# Patient Record
Sex: Male | Born: 1990 | Race: White | Hispanic: No | Marital: Single | Smoking: Never smoker
Health system: Southern US, Community
[De-identification: ages and names within clinical notes are randomized; demographics above are authoritative.]

## PROBLEM LIST (undated history)

## (undated) DIAGNOSIS — I10 Essential (primary) hypertension: Secondary | ICD-10-CM

## (undated) HISTORY — DX: Essential (primary) hypertension: I10

---
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
## 2014-09-20 ENCOUNTER — Ambulatory Visit (INDEPENDENT_AMBULATORY_CARE_PROVIDER_SITE_OTHER): Payer: Worker's Compensation

## 2014-09-20 ENCOUNTER — Ambulatory Visit (INDEPENDENT_AMBULATORY_CARE_PROVIDER_SITE_OTHER): Payer: Worker's Compensation | Admitting: Family Medicine

## 2014-09-20 ENCOUNTER — Encounter: Payer: Self-pay | Admitting: Family Medicine

## 2014-09-20 VITALS — BP 139/84 | HR 76 | Temp 98.6°F | Ht 72.0 in | Wt 310.0 lb

## 2014-09-20 DIAGNOSIS — R0782 Intercostal pain: Secondary | ICD-10-CM | POA: Insufficient documentation

## 2014-09-20 DIAGNOSIS — I10 Essential (primary) hypertension: Secondary | ICD-10-CM | POA: Insufficient documentation

## 2014-09-20 NOTE — Progress Notes (Signed)
BP 139/84 mmHg  Pulse 76  Temp(Src) 98.6 F (37 C) (Oral)  Ht 6' (1.829 m)  Wt 310 lb (140.615 kg)  BMI 42.03 kg/m2   Subjective:    Patient ID: Steven Christian, male    DOB: Jan 28, 1990, 24 y.o.   MRN: [id number]  HPI: Steven Christian is a 24 y.o. male presenting on 09/20/2014 for chest wall pain   HPI Worker's Comp. Patient presents today for worker's comp injury. He injured himself yesterday at work for routine. He injured himself in the center of his chest anteriorly. He injured himself trying to catch a very large rack and it fell on top of his chest. He does not have any bruising he does not complain of any difficulties breathing, moving his arms, lifting. Chest x-ray was performed today. Patient has been taking the occasional ibuprofen and been satisfied with that.  Relevant past medical, surgical, family and social history reviewed and updated as indicated. Interim medical history since our last visit reviewed. Allergies and medications reviewed and updated.  Review of Systems  Constitutional: Negative for fever and chills.  HENT: Negative for ear discharge and ear pain.   Eyes: Negative for discharge and visual disturbance.  Respiratory: Negative for shortness of breath and wheezing.   Cardiovascular: Positive for chest pain (where object fell on his chest). Negative for leg swelling.  Gastrointestinal: Negative for abdominal pain, diarrhea and constipation.  Genitourinary: Negative for difficulty urinating.  Musculoskeletal: Negative for back pain and gait problem.  Skin: Negative for rash.  Neurological: Negative for dizziness, syncope, light-headedness and headaches.  All other systems reviewed and are negative.   Per HPI unless specifically indicated above     Medication List       This list is accurate as of: 09/20/14  5:08 PM.  Always use your most recent med list.               losartan 100 MG tablet  Commonly known as:  COZAAR  Take 100 mg by mouth  daily.           Objective:    BP 139/84 mmHg  Pulse 76  Temp(Src) 98.6 F (37 C) (Oral)  Ht 6' (1.829 m)  Wt 310 lb (140.615 kg)  BMI 42.03 kg/m2  Wt Readings from Last 3 Encounters:  09/20/14 310 lb (140.615 kg)    Physical Exam  Constitutional: He is oriented to person, place, and time. He appears well-developed and well-nourished. No distress.  Eyes: Conjunctivae and EOM are normal. Pupils are equal, round, and reactive to light. Right eye exhibits no discharge. No scleral icterus.  Cardiovascular: Normal rate, regular rhythm, normal heart sounds and intact distal pulses.   No murmur heard. Pulmonary/Chest: Effort normal and breath sounds normal. No respiratory distress. He has no wheezes. He exhibits tenderness (palpable tenderness along the ixth to seventh rib on the left side of the sternum, and along the third rib on the right side of his sternum. No visible bruising).  Abdominal: He exhibits no distension.  Musculoskeletal: Normal range of motion. He exhibits no edema.  Neurological: He is alert and oriented to person, place, and time. Coordination normal.  Skin: Skin is warm and dry. No rash noted. He is not diaphoretic.  Psychiatric: He has a normal mood and affect. His behavior is normal.  Vitals reviewed.  Chest x-ray: Preliminary read by Dr. Louanne Skye on the chest x-ray shows no bony abnormalities and normal lung spaces. Await final read by radiology.  No results found for this or any previous visit.    Assessment & Plan:   Problem List Items Addressed This Visit      Other   Intercostal pain - Primary    Patient has pain in the anterior chest where the object fell on his chest. No visible bruising noted and no abnormalities noted on chest x-ray. Await final read on chest x-ray from radiologist. Recommend ibuprofen and ice and heat. May return to work with no time off needed.      Relevant Orders   DG Chest 2 View (Completed)       Follow up  plan: Return if symptoms worsen or fail to improve.  Arville Care, MD [facility] 09/20/2014, 5:08 PM

## 2014-09-20 NOTE — Patient Instructions (Signed)

## 2014-09-20 NOTE — Assessment & Plan Note (Signed)
Patient has pain in the anterior chest where the object fell on his chest. No visible bruising noted and no abnormalities noted on chest x-ray. Await final read on chest x-ray from radiologist. Recommend ibuprofen and ice and heat. May return to work with no time off needed.

## 2015-09-25 ENCOUNTER — Encounter: Payer: Self-pay | Admitting: Nurse Practitioner

## 2015-09-25 ENCOUNTER — Ambulatory Visit (INDEPENDENT_AMBULATORY_CARE_PROVIDER_SITE_OTHER): Payer: Worker's Compensation | Admitting: Nurse Practitioner

## 2015-09-25 ENCOUNTER — Ambulatory Visit (INDEPENDENT_AMBULATORY_CARE_PROVIDER_SITE_OTHER): Payer: Worker's Compensation

## 2015-09-25 VITALS — BP 146/88 | HR 101 | Temp 98.6°F | Ht 72.0 in | Wt 296.0 lb

## 2015-09-25 DIAGNOSIS — M79642 Pain in left hand: Secondary | ICD-10-CM | POA: Diagnosis not present

## 2015-09-25 DIAGNOSIS — S60052A Contusion of left little finger without damage to nail, initial encounter: Secondary | ICD-10-CM | POA: Diagnosis not present

## 2015-09-25 NOTE — Progress Notes (Signed)
   Subjective:    Patient ID: Lanell PersonsLandon Keithly, male    DOB: 09-19-1990, 25 y.o.   MRN: [id number]  HPI  DATE OF INJURY- 09/18/15  [place] Patient was at work on 09/18/15 and he went to hand a part to co-worker and they did not have grip on it and it fell and landed on his left hand- was sore and swollen but was able to function- has a knot on his hand that is still tender to touch and it hurts to grip things.   Review of Systems  Constitutional: Negative.   HENT: Negative.   Respiratory: Negative.   Cardiovascular: Negative.   Genitourinary: Negative.   Musculoskeletal:       Hand pain  Neurological: Negative.   Psychiatric/Behavioral: Negative.   All other systems reviewed and are negative.      Objective:   Physical Exam  Constitutional: He appears well-developed and well-nourished. No distress.  Cardiovascular: Normal rate, regular rhythm and normal heart sounds.   Pulmonary/Chest: Effort normal and breath sounds normal.  Musculoskeletal:  2cm tnder soft nodule at left second metacarpal- slight pain with gripping.  Skin: Skin is warm.  Psychiatric: He has a normal mood and affect. His behavior is normal. Judgment and thought content normal.    BP (!) 146/88 (BP Location: Left Arm, Cuff Size: Normal)   Pulse (!) 101   Temp 98.6 F (37 C) (Oral)   Ht 6' (1.829 m)   Wt 296 lb (134.3 kg)   BMI 40.14 kg/m   Left hand x ray- no fractures-Preliminary reading by Paulene FloorMary Belma Dyches, FNP  [facility]      Assessment & Plan:   1. Pain of left hand   2. Contusion of fifth finger of left hand, initial encounter    Soak in epsom salt BID if helps Motrin or tylenol OTC RTO prn  Mary-Margaret Daphine DeutscherMartin, FNP

## 2016-07-30 IMAGING — CR DG CHEST 2V
2 series · 2 of 2 positions shown · non-contrast
Comparison: None.

CLINICAL DATA: 40 lb weight fell on chest. Chest wall pain. Initial
encounter.

EXAM:
CHEST  2 VIEW

[view not recorded (1 of 2)]
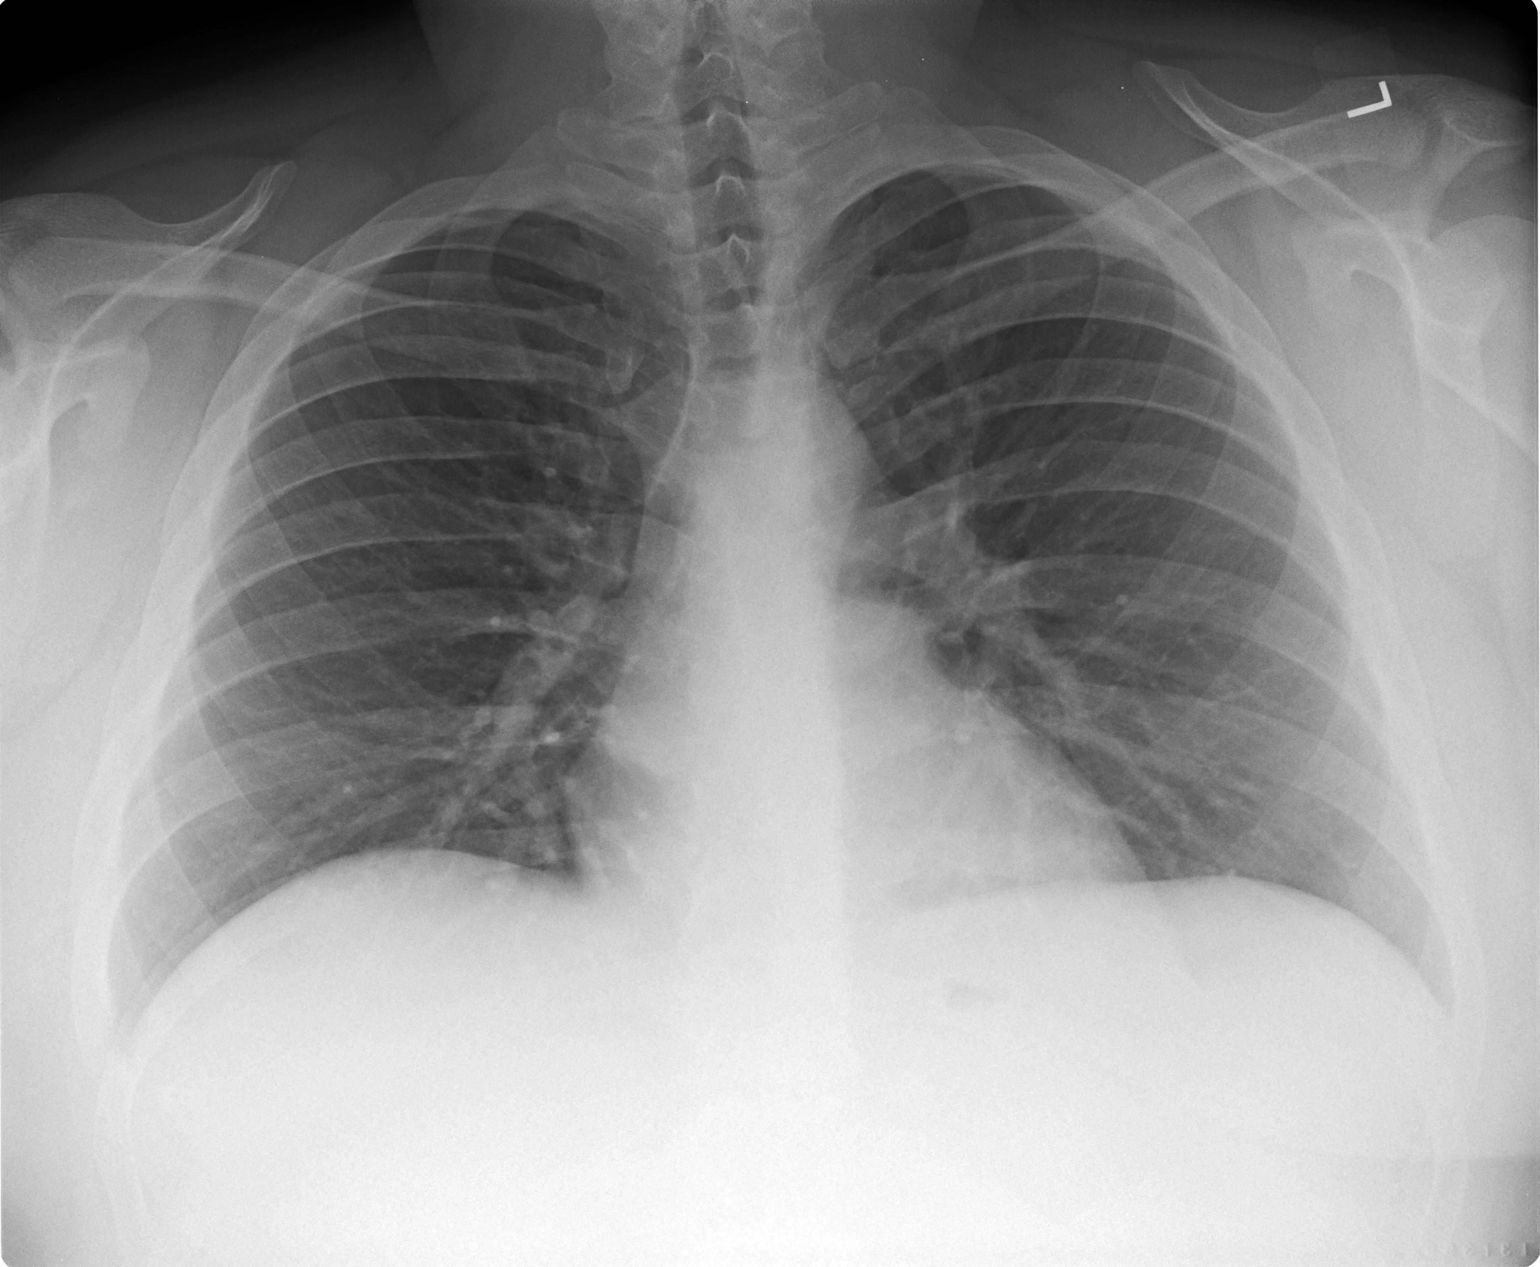

[view not recorded (2 of 2)]
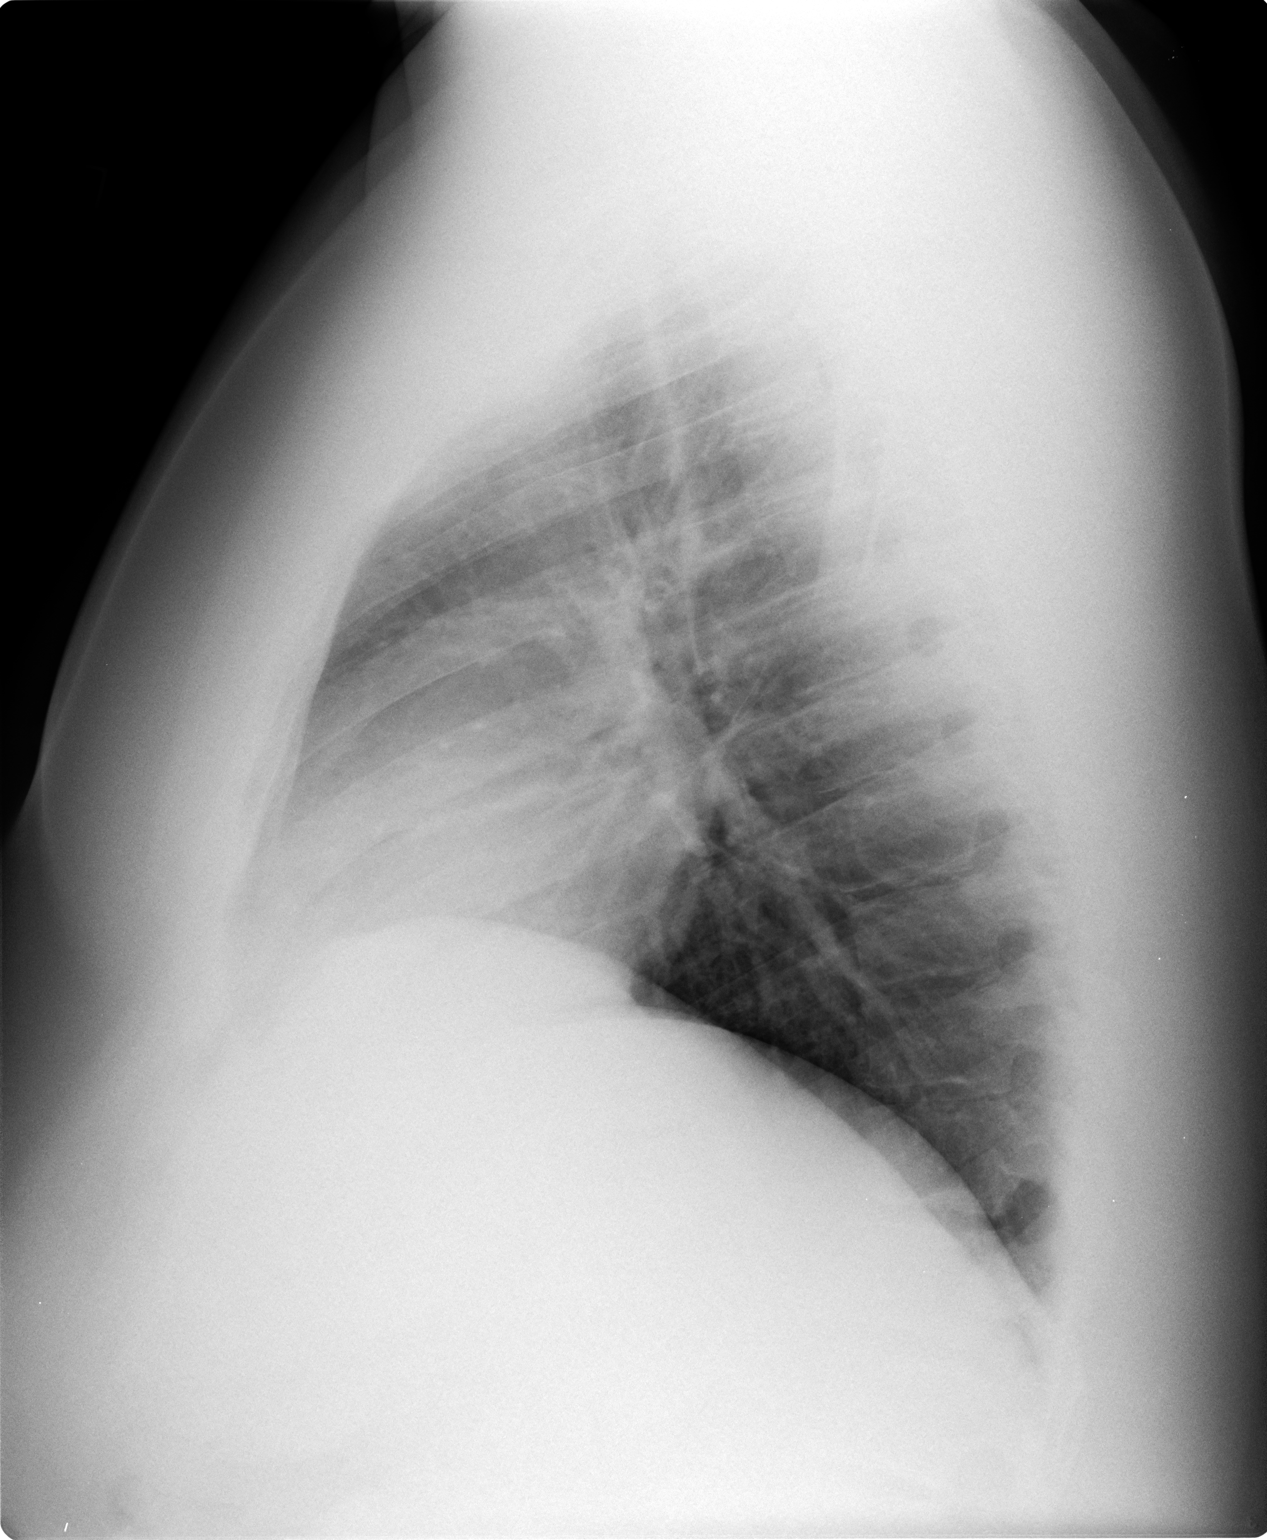

[2 of 2 positions shown; findings below may reference images not displayed]

FINDINGS: The heart size and mediastinal contours are within normal limits.
Both lungs are clear. No evidence of pneumothorax or hemothorax. The
visualized skeletal structures are unremarkable.
IMPRESSION: Negative.  No active disease.

## 2017-08-04 IMAGING — DX DG HAND COMPLETE 3+V*L*
3 series · 3 of 3 positions shown · non-contrast
Comparison: None.

CLINICAL DATA: 25-year-old male with hand pain

EXAM:
LEFT HAND - COMPLETE 3+ VIEW

[hand pa]
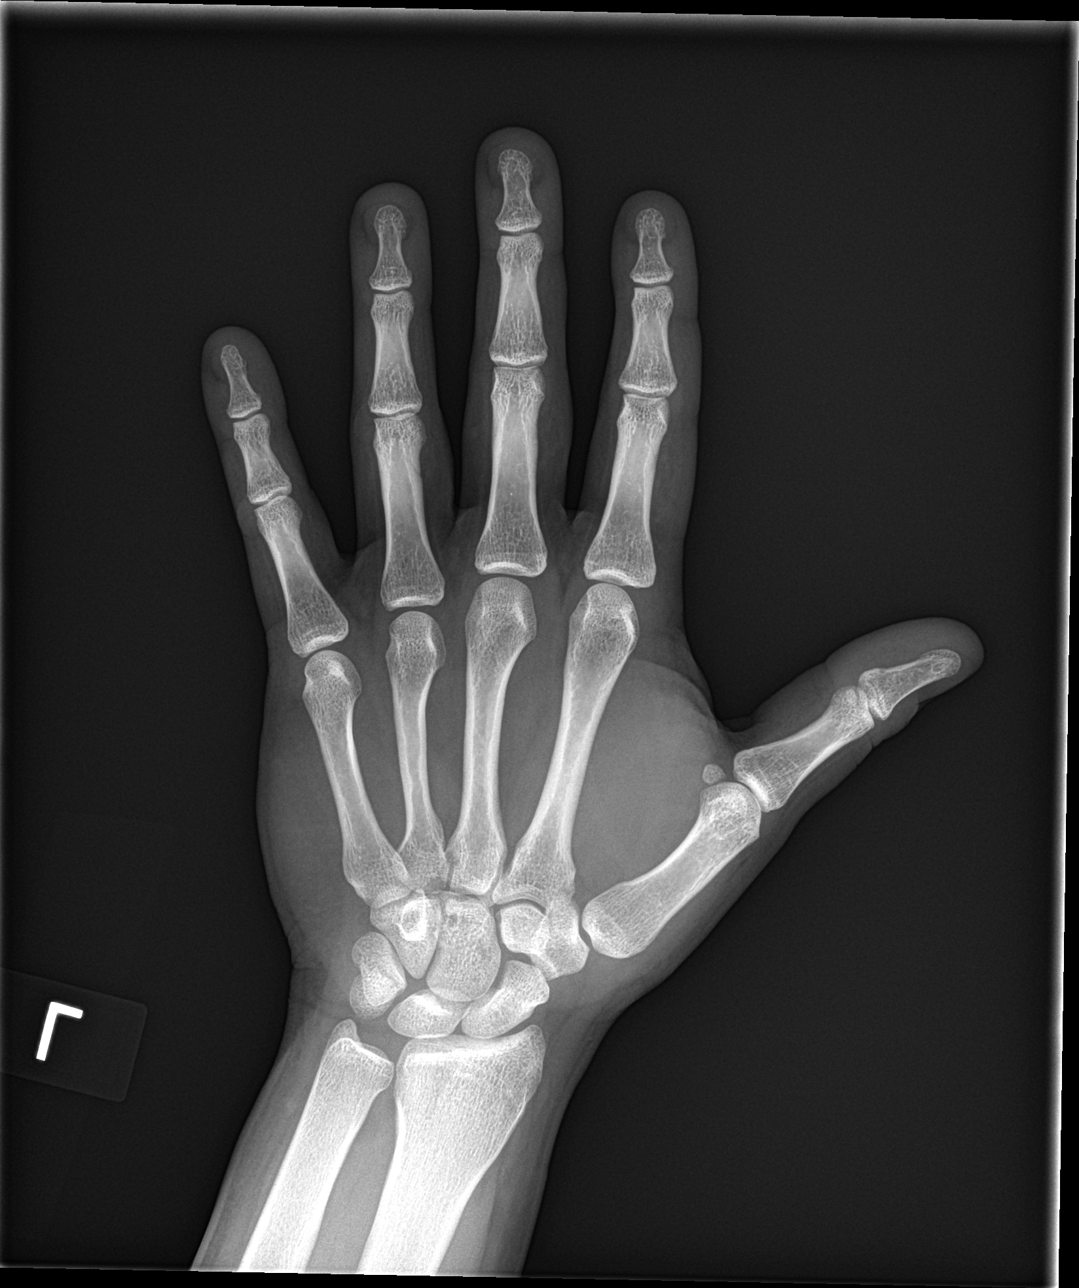

[hand obl]
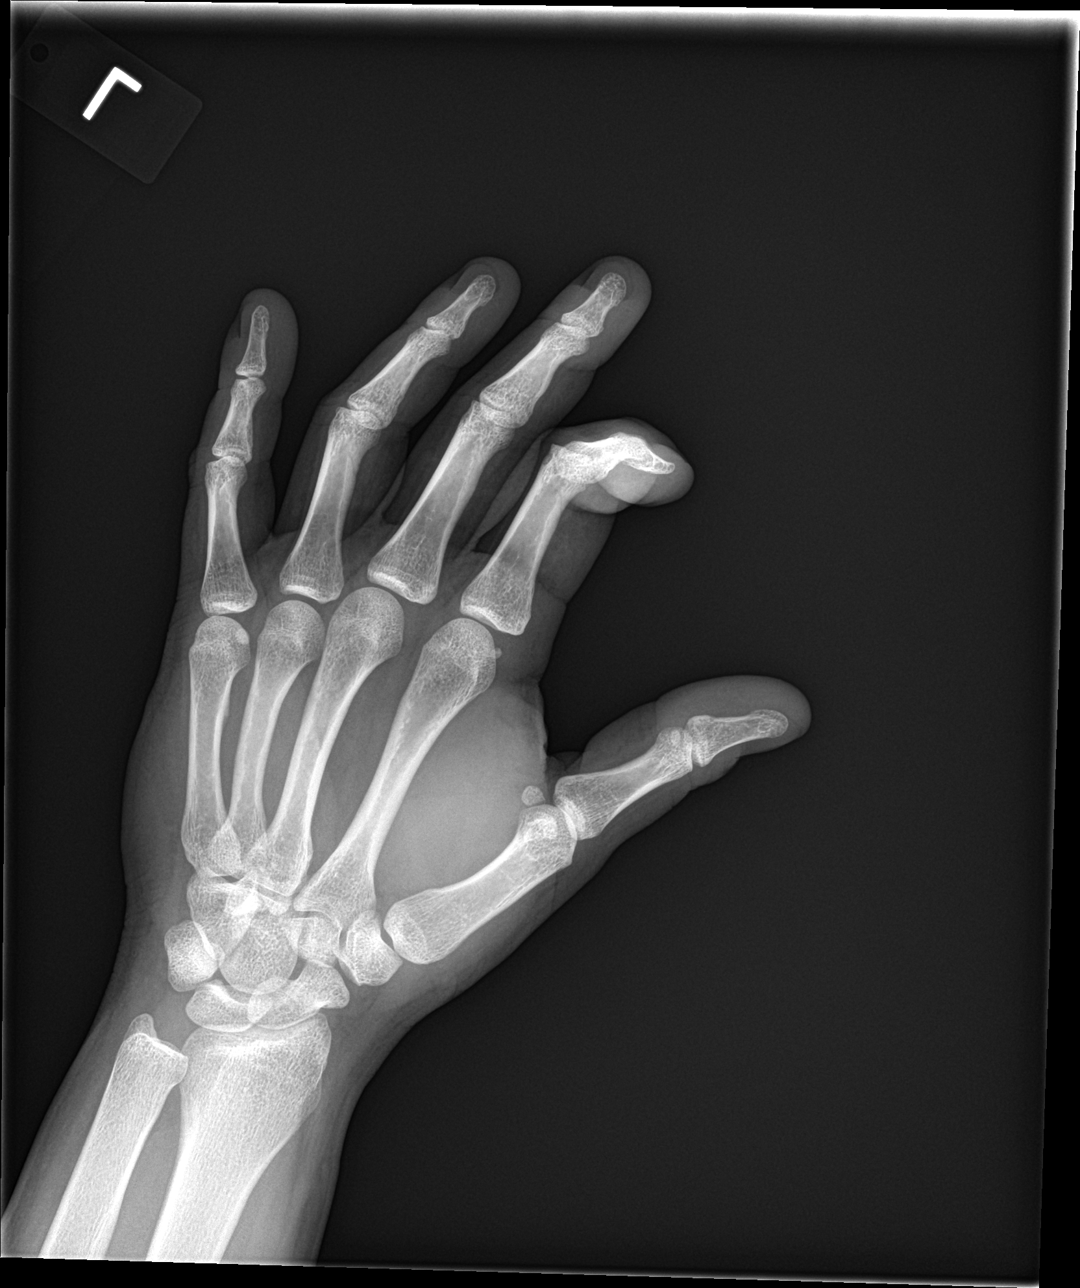

[hand lat]
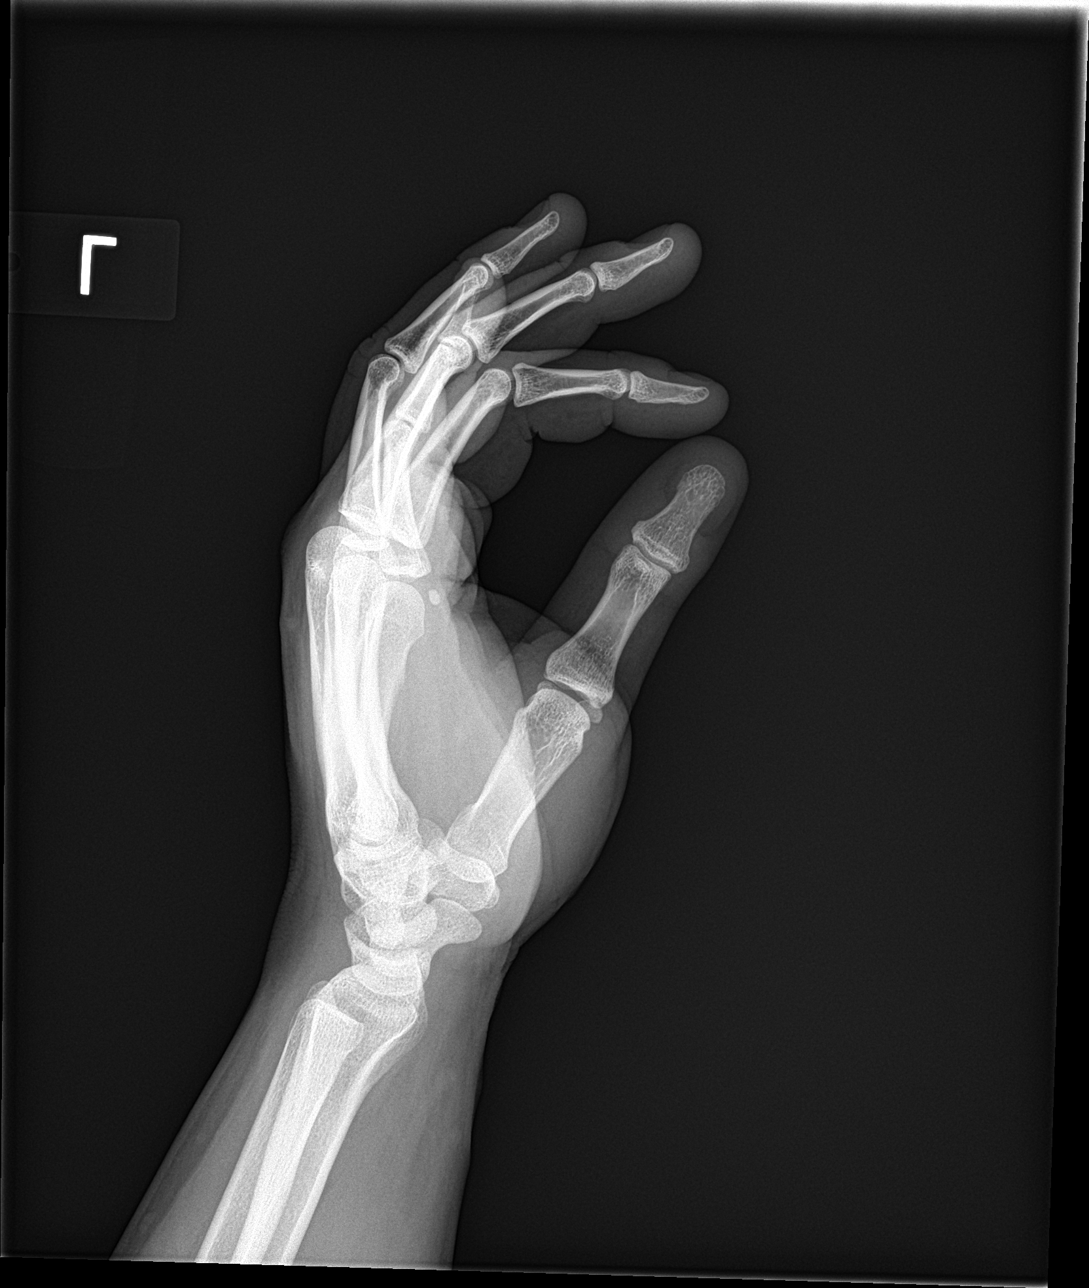

[3 of 3 positions shown; findings below may reference images not displayed]

FINDINGS: There is no evidence of fracture or dislocation. There is no
evidence of arthropathy or other focal bone abnormality. Soft
tissues are unremarkable.
IMPRESSION: Negative.
